# Patient Record
Sex: Male | Born: 1990 | Race: Black or African American | Hispanic: No | Marital: Single | Smoking: Current every day smoker
Health system: Southern US, Community
[De-identification: ages and names within clinical notes are randomized; demographics above are authoritative.]

## PROBLEM LIST (undated history)

## (undated) DIAGNOSIS — J45909 Unspecified asthma, uncomplicated: Secondary | ICD-10-CM

## (undated) HISTORY — PX: TONSILLECTOMY: SUR1361

---
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
## 2012-09-08 ENCOUNTER — Emergency Department (HOSPITAL_COMMUNITY)
Admission: EM | Admit: 2012-09-08 | Discharge: 2012-09-08 | Disposition: A | Discharge status: Home / Self Care | Payer: Medicaid Other | Attending: Emergency Medicine | Admitting: Emergency Medicine

## 2012-09-08 ENCOUNTER — Encounter (HOSPITAL_COMMUNITY): Payer: Self-pay | Admitting: Family Medicine

## 2012-09-08 DIAGNOSIS — N342 Other urethritis: Secondary | ICD-10-CM | POA: Insufficient documentation

## 2012-09-08 DIAGNOSIS — F17200 Nicotine dependence, unspecified, uncomplicated: Secondary | ICD-10-CM | POA: Insufficient documentation

## 2012-09-08 HISTORY — DX: Unspecified asthma, uncomplicated: J45.909

## 2012-09-08 LAB — URINALYSIS, ROUTINE W REFLEX MICROSCOPIC
Bilirubin Urine: NEGATIVE
Glucose, UA: NEGATIVE mg/dL
Hgb urine dipstick: NEGATIVE
Ketones, ur: NEGATIVE mg/dL
Nitrite: NEGATIVE
Protein, ur: NEGATIVE mg/dL
Specific Gravity, Urine: 1.018 (ref 1.005–1.030)
Urobilinogen, UA: 1 mg/dL (ref 0.0–1.0)
pH: 6 (ref 5.0–8.0)

## 2012-09-08 LAB — URINE MICROSCOPIC-ADD ON

## 2012-09-08 MED ORDER — LIDOCAINE HCL (PF) 1 % IJ SOLN
INTRAMUSCULAR | Status: AC
  Administered 2012-09-08: 2 mL
  Filled 2012-09-08: qty 5

## 2012-09-08 MED ORDER — ONDANSETRON 4 MG PO TBDP
8.0000 mg | ORAL_TABLET | Freq: Once | ORAL | Status: AC
  Administered 2012-09-08: 8 mg via ORAL
  Filled 2012-09-08: qty 2

## 2012-09-08 MED ORDER — AZITHROMYCIN 1 G PO PACK
1.0000 g | PACK | Freq: Once | ORAL | Status: AC
  Administered 2012-09-08: 1 g via ORAL
  Filled 2012-09-08: qty 1

## 2012-09-08 MED ORDER — CEFTRIAXONE SODIUM 250 MG IJ SOLR
250.0000 mg | Freq: Once | INTRAMUSCULAR | Status: AC
  Administered 2012-09-08: 250 mg via INTRAMUSCULAR
  Filled 2012-09-08: qty 250

## 2012-09-08 NOTE — ED Provider Notes (Signed)
History   This chart was scribed for Hurman Horn, MD by Melba Coon. The patient was seen in room TR04C/TR04C and the patient's care was started at 7:37PM.    CSN: [id number]  Arrival date & time 09/08/12  1718   None     Chief Complaint  Patient presents with  . Exposure to STD    (Consider location/radiation/quality/duration/timing/severity/associated sxs/prior treatment) HPI Jeffrey Rodgers is a 21 y.o. male who presents to the Emergency Department complaining of persistent, moderate watery penile d/c with an onset 2 weeks ago. Jeffrey Rodgers states that he was having unprotected sex. Pruritis around the groin and penile area present. No testicular pain. No HA, fever, neck pain, sore throat, rash, back pain, CP, SOB, abd pain, n/v/d, dysuria, or extremity pain, edema, weakness, numbness, or tingling. Coughing and congestion with CP present from seasonal allergies. No Hx of HIV/AIDS. No known allergies. No other pertinent medical symptoms.  Past Medical History  Diagnosis Date  . Asthma     History reviewed. No pertinent past surgical history.  History reviewed. No pertinent family history.  History  Substance Use Topics  . Smoking status: Current Every Day Smoker  . Smokeless tobacco: Not on file  . Alcohol Use: No      Review of Systems 10 Systems reviewed and all are negative for acute change except as noted in the HPI.   Allergies  Review of patient's allergies indicates no known allergies.  Home Medications  No current outpatient prescriptions on file.  BP 113/69  Pulse 58  Temp 99.7 F (37.6 C) (Oral)  Resp 16  SpO2 98%  Physical Exam  Nursing note and vitals reviewed. Constitutional: He is oriented to person, place, and time. He appears well-developed and well-nourished. No distress.  HENT:  Head: Normocephalic and atraumatic.  Eyes: EOM are normal. Pupils are equal, round, and reactive to light.  Neck: Normal range of motion. Neck supple. No  tracheal deviation present.  Cardiovascular: Normal rate, normal heart sounds and intact distal pulses.   Pulmonary/Chest: Effort normal and breath sounds normal. No respiratory distress.  Abdominal: Bowel sounds are normal. He exhibits no distension. There is no tenderness.  Genitourinary:       No testicular tenderness. No inguinal LAN or tenderness. No penile lesions.  Musculoskeletal: Normal range of motion. He exhibits no edema and no tenderness.  Neurological: He is alert and oriented to person, place, and time. He has normal strength. No cranial nerve deficit or sensory deficit.  Skin: Skin is warm and dry. No rash noted.  Psychiatric: He has a normal mood and affect. His behavior is normal.    ED Course  Procedures (including critical care time)  COORDINATION OF CARE:  7:41PM - UA results reviewed and are unremarkable. STD workup for chlamydia and gonorrhea will be ordered for Jeffrey. Raul Rodgers. He is advised to f/u with his PCP for the results. He requests to have an HIV test with his PCP. He is also advised to notify all sexual partners that he got tested today and to use protection in the future. He will be treated with Rocephin and Zithromax. Jeffrey Rodgers is ready for d/c.   Labs Reviewed  URINALYSIS, ROUTINE W REFLEX MICROSCOPIC - Abnormal; Notable for the following:    Leukocytes, UA MODERATE (*)     All other components within normal limits  GC/CHLAMYDIA PROBE AMP, GENITAL  URINE MICROSCOPIC-ADD ON   No results found.   1. Urethritis  MDM  I personally performed the services described in this documentation, which was scribed in my presence. The recorded information has been reviewed and considered. Pt stable in ED with no significant deterioration in condition.Patient / Family / Caregiver informed of clinical course, understand medical decision-making process, and agree with plan.I doubt any other EMC precluding discharge at this time including, but not necessarily  limited to the following:nec fasc, sepsis.      Hurman Horn, MD 09/09/12 [phone]
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
## 2013-09-19 ENCOUNTER — Emergency Department (HOSPITAL_COMMUNITY)
Admission: EM | Admit: 2013-09-19 | Discharge: 2013-09-19 | Disposition: A | Discharge status: Home / Self Care | Payer: Medicaid Other | Source: Home / Self Care

## 2013-09-19 ENCOUNTER — Encounter (HOSPITAL_COMMUNITY): Payer: Self-pay | Admitting: Emergency Medicine

## 2013-09-19 ENCOUNTER — Emergency Department (INDEPENDENT_AMBULATORY_CARE_PROVIDER_SITE_OTHER)
Admission: EM | Admit: 2013-09-19 | Discharge: 2013-09-19 | Disposition: A | Discharge status: Home / Self Care | Payer: Self-pay | Source: Home / Self Care | Attending: Family Medicine | Admitting: Family Medicine

## 2013-09-19 DIAGNOSIS — H60399 Other infective otitis externa, unspecified ear: Secondary | ICD-10-CM

## 2013-09-19 DIAGNOSIS — L03116 Cellulitis of left lower limb: Secondary | ICD-10-CM

## 2013-09-19 DIAGNOSIS — H6091 Unspecified otitis externa, right ear: Secondary | ICD-10-CM

## 2013-09-19 DIAGNOSIS — L02419 Cutaneous abscess of limb, unspecified: Secondary | ICD-10-CM

## 2013-09-19 MED ORDER — CIPROFLOXACIN-DEXAMETHASONE 0.3-0.1 % OT SUSP: 4.0000 [drp] | Freq: Two times a day (BID) | OTIC | Status: DC

## 2013-09-19 MED ORDER — DOXYCYCLINE HYCLATE 100 MG PO CAPS: 100.0000 mg | ORAL_CAPSULE | Freq: Two times a day (BID) | ORAL | Status: DC

## 2013-09-19 NOTE — ED Provider Notes (Signed)
Jeffrey Rodgers is a 22 y.o. male who presents to Urgent Care today for  1) left lateral thigh skin infection. Patient notes a small bump that has become larger more painful and red over the last 2 days. He has not tried any medications or treatment. He denies any radiating pain weakness numbness fevers or chills. He feels well otherwise. He denies any injury or actual bug bite. 2) right ear drainage and pain for the last 2 months. Patient notes mild pain associated with foul-smelling urine drainage. He feels well otherwise no fevers chills nausea vomiting or diarrhea   Past Medical History  Diagnosis Date  . Asthma    History  Substance Use Topics  . Smoking status: Current Every Day Smoker  . Smokeless tobacco: Not on file  . Alcohol Use: No   ROS as above Medications reviewed. No current facility-administered medications for this encounter.   Current Outpatient Prescriptions  Medication Sig Dispense Refill  . ciprofloxacin-dexamethasone (CIPRODEX) otic suspension Place 4 drops into the right ear 2 (two) times daily.  7.5 mL  0  . doxycycline (VIBRAMYCIN) 100 MG capsule Take 1 capsule (100 mg total) by mouth 2 (two) times daily.  20 capsule  0    Exam:  BP 115/73  Pulse 48  Temp(Src) 97.9 F (36.6 C) (Oral)  Resp 20  SpO2 99% Gen: Well NAD HEENT: EOMI,  MMM, right tympanic membrane with yellowish mucousy appearance external to the tympanic membrane. Left tympanic membranes normal appearing Lungs: CTABL Nl WOB Heart: RRR no MRG Abd: NABS, NT, ND Exts: Non edematous BL  LE, warm and well perfused.  Skin: Left lateral leg: Quarter-sized indurated erythematous tender nodule. No fluctuance or drainage. No surrounding skin changes.  No results found for this or any previous visit (from the past 24 hour(s)). No results found.  Assessment and Plan: 22 y.o. male with  1) left lateral leg cellulitis. Plan to treat with doxycycline. Followup as needed. 2) otitis externa of the  right ear: Plan to treat with Ciprodex eardrops 2 Aleve twice daily for pain as needed Discussed warning signs or symptoms. Please see discharge instructions. Patient expresses understanding.      Rodolph Bong, MD 09/19/13 [phone]

## 2013-09-19 NOTE — ED Notes (Addendum)
Patient c/o pain and swelling left thigh, "insect bite" that has been draining pus x 2 days; pain "9" on 1-10 scale, w/d/color good, calm, conversant

## 2013-12-08 ENCOUNTER — Emergency Department (HOSPITAL_COMMUNITY)
Admission: EM | Admit: 2013-12-08 | Discharge: 2013-12-08 | Disposition: A | Discharge status: Home / Self Care | Payer: Medicaid Other | Attending: Emergency Medicine | Admitting: Emergency Medicine

## 2013-12-08 ENCOUNTER — Encounter (HOSPITAL_COMMUNITY): Payer: Self-pay | Admitting: Emergency Medicine

## 2013-12-08 DIAGNOSIS — R319 Hematuria, unspecified: Secondary | ICD-10-CM | POA: Insufficient documentation

## 2013-12-08 DIAGNOSIS — J45909 Unspecified asthma, uncomplicated: Secondary | ICD-10-CM | POA: Insufficient documentation

## 2013-12-08 DIAGNOSIS — R599 Enlarged lymph nodes, unspecified: Secondary | ICD-10-CM | POA: Insufficient documentation

## 2013-12-08 DIAGNOSIS — F17200 Nicotine dependence, unspecified, uncomplicated: Secondary | ICD-10-CM | POA: Insufficient documentation

## 2013-12-08 DIAGNOSIS — Z792 Long term (current) use of antibiotics: Secondary | ICD-10-CM | POA: Insufficient documentation

## 2013-12-08 LAB — URINALYSIS, ROUTINE W REFLEX MICROSCOPIC
Glucose, UA: NEGATIVE mg/dL
Ketones, ur: 15 mg/dL — AB
Protein, ur: 30 mg/dL — AB
Urobilinogen, UA: 1 mg/dL (ref 0.0–1.0)

## 2013-12-08 LAB — URINE MICROSCOPIC-ADD ON

## 2013-12-08 MED ORDER — CEFTRIAXONE SODIUM 250 MG IJ SOLR
250.0000 mg | Freq: Once | INTRAMUSCULAR | Status: AC
  Administered 2013-12-08: 250 mg via INTRAMUSCULAR
  Filled 2013-12-08: qty 250

## 2013-12-08 MED ORDER — AZITHROMYCIN 250 MG PO TABS
1000.0000 mg | ORAL_TABLET | Freq: Once | ORAL | Status: AC
  Administered 2013-12-08: 1000 mg via ORAL
  Filled 2013-12-08: qty 4

## 2013-12-08 NOTE — ED Notes (Signed)
Pt presents with blood in his urine since yesterday.
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
## 2013-12-08 NOTE — ED Provider Notes (Signed)
CSN: [id number]     Arrival date & time 12/08/13  1326 History  This chart was scribed for non-physician practitioner, Rhea Bleacher, PA-C working with Junius Argyle, MD by Greggory Stallion, ED scribe. This patient was seen in room WTR7/WTR7 and the patient's care was started at 5:38 PM.   Chief Complaint  Patient presents with  . Hematuria   The history is provided by the patient. No language interpreter was used.   HPI Comments: Jeffrey Rodgers is a 22 y.o. male who presents to the Emergency Department complaining of hematuria that started yesterday. States there is blood the entire time he urinates. He states he has been having unprotected sex with his girlfriend but denies any other partners. Pt states he sometimes has a mildly painful bump near his genitals. Denies fever, dysuria, penile discharge, penile pain, testicular pain, penile sores or lesions, back pain. Denies history of kidney stone. He has a history of asthma but denies other medical problems.   Past Medical History  Diagnosis Date  . Asthma    History reviewed. No pertinent past surgical history. History reviewed. No pertinent family history. History  Substance Use Topics  . Smoking status: Current Every Day Smoker -- 0.50 packs/day    Types: Cigarettes  . Smokeless tobacco: Not on file  . Alcohol Use: No    Review of Systems  Constitutional: Negative for fever.  HENT: Negative for sore throat.   Eyes: Negative for discharge.  Gastrointestinal: Negative for rectal pain.  Genitourinary: Positive for hematuria. Negative for dysuria, frequency, discharge, genital sores, penile pain and testicular pain.  Musculoskeletal: Negative for arthralgias and back pain.  Skin: Negative for rash.  Hematological: Negative for adenopathy.    Allergies  Review of patient's allergies indicates no known allergies.  Home Medications   Current Outpatient Rx  Name  Route  Sig  Dispense  Refill  . ciprofloxacin-dexamethasone  (CIPRODEX) otic suspension   Right Ear   Place 4 drops into the right ear 2 (two) times daily.   7.5 mL   0   . doxycycline (VIBRAMYCIN) 100 MG capsule   Oral   Take 1 capsule (100 mg total) by mouth 2 (two) times daily.   20 capsule   0    BP 117/74  Pulse 54  Temp(Src) 98.9 F (37.2 C) (Oral)  Resp 19  SpO2 100%  Physical Exam  Nursing note and vitals reviewed. Constitutional: He is oriented to person, place, and time. He appears well-developed and well-nourished. No distress.  HENT:  Head: Normocephalic and atraumatic.  Eyes: Conjunctivae and EOM are normal.  Neck: Normal range of motion. Neck supple. No tracheal deviation present.  Cardiovascular: Normal rate.   Pulmonary/Chest: Effort normal. No respiratory distress.  Abdominal: Soft. He exhibits no distension. There is no tenderness. There is no rebound and no guarding. Hernia confirmed negative in the right inguinal area and confirmed negative in the left inguinal area.  Genitourinary: Testes normal and penis normal.    Right testis shows no mass, no swelling and no tenderness. Left testis shows no mass, no swelling and no tenderness. No penile tenderness. No discharge found.  Small right inguinal lymph node.   Musculoskeletal: Normal range of motion.  Lymphadenopathy:       Right: Inguinal adenopathy present.       Left: No inguinal adenopathy present.  Neurological: He is alert and oriented to person, place, and time.  Skin: Skin is warm and dry.  Psychiatric: He has  a normal mood and affect. His behavior is normal.    ED Course  Procedures (including critical care time)  DIAGNOSTIC STUDIES: Oxygen Saturation is 100% on RA, normal by my interpretation.    COORDINATION OF CARE: 5:43 PM-Discussed treatment plan which includes UA with pt at bedside and pt agreed to plan. Advised pt to follow up with a urologist if symptoms do not resolve.   Labs Review Labs Reviewed  URINALYSIS, ROUTINE W REFLEX  MICROSCOPIC - Abnormal; Notable for the following:    Color, Urine RED (*)    APPearance TURBID (*)    Hgb urine dipstick LARGE (*)    Bilirubin Urine MODERATE (*)    Ketones, ur 15 (*)    Protein, ur 30 (*)    Nitrite POSITIVE (*)    Leukocytes, UA MODERATE (*)    All other components within normal limits  GC/CHLAMYDIA PROBE AMP  URINE MICROSCOPIC-ADD ON   Imaging Review No results found.  EKG Interpretation   None      Vital signs reviewed and are as follows: Filed Vitals:   12/08/13 1858  BP: 132/68  Pulse: 51  Temp: 97.8 F (36.6 C)  Resp: 18   GU exam performed. Pt informed of results.   Plan: Tx for GC/chlamydia given, urine probe sent. Will treat for infection as this is most likely cause. If it does not improve, will need urology f/u (given).   Patient urged to return with worsening symptoms or other concerns, including lightheadedness/syncope. Patient verbalized understanding and agrees with plan.    MDM   1. Hematuria    Painless hematuria in 22yo male. Most likely etiology is infection -- treated for gonorrhea/chlamydia. If not improving, will need urology f/u. No systemic symptoms of anemia, VSS. No dysuria, no fever or discharge.  I personally performed the services described in this documentation, which was scribed in my presence. The recorded information has been reviewed and is accurate.   Renne Crigler, PA-C 12/08/13 [id number]

## 2013-12-09 NOTE — ED Provider Notes (Signed)
Medical screening examination/treatment/procedure(s) were performed by non-physician practitioner and as supervising physician I was immediately available for consultation/collaboration.  EKG Interpretation   None         Junius Argyle, MD 12/09/13 [phone]

## 2013-12-10 LAB — GC/CHLAMYDIA PROBE AMP: CT Probe RNA: POSITIVE — AB

## 2013-12-11 ENCOUNTER — Telehealth (HOSPITAL_COMMUNITY): Payer: Self-pay | Admitting: Emergency Medicine

## 2013-12-11 NOTE — ED Notes (Signed)
+  Chlamydia. Patient treated with Rocephin and Zithromax.

## 2013-12-11 NOTE — ED Notes (Signed)
Patient has +Chlamydia. 

## 2014-07-06 ENCOUNTER — Encounter (HOSPITAL_COMMUNITY): Payer: Self-pay | Admitting: Emergency Medicine

## 2014-07-06 ENCOUNTER — Emergency Department (HOSPITAL_COMMUNITY): Payer: Medicaid Other

## 2014-07-06 ENCOUNTER — Emergency Department (HOSPITAL_COMMUNITY)
Admission: EM | Admit: 2014-07-06 | Discharge: 2014-07-06 | Disposition: A | Discharge status: Home / Self Care | Payer: Medicaid Other | Attending: Emergency Medicine | Admitting: Emergency Medicine

## 2014-07-06 DIAGNOSIS — S8990XA Unspecified injury of unspecified lower leg, initial encounter: Secondary | ICD-10-CM | POA: Diagnosis present

## 2014-07-06 DIAGNOSIS — S99919A Unspecified injury of unspecified ankle, initial encounter: Secondary | ICD-10-CM

## 2014-07-06 DIAGNOSIS — Z792 Long term (current) use of antibiotics: Secondary | ICD-10-CM | POA: Insufficient documentation

## 2014-07-06 DIAGNOSIS — F17200 Nicotine dependence, unspecified, uncomplicated: Secondary | ICD-10-CM | POA: Insufficient documentation

## 2014-07-06 DIAGNOSIS — J45909 Unspecified asthma, uncomplicated: Secondary | ICD-10-CM | POA: Insufficient documentation

## 2014-07-06 DIAGNOSIS — S82109B Unspecified fracture of upper end of unspecified tibia, initial encounter for open fracture type I or II: Secondary | ICD-10-CM | POA: Diagnosis not present

## 2014-07-06 DIAGNOSIS — S99929A Unspecified injury of unspecified foot, initial encounter: Secondary | ICD-10-CM

## 2014-07-06 DIAGNOSIS — W3400XA Accidental discharge from unspecified firearms or gun, initial encounter: Secondary | ICD-10-CM

## 2014-07-06 DIAGNOSIS — Z23 Encounter for immunization: Secondary | ICD-10-CM | POA: Diagnosis not present

## 2014-07-06 DIAGNOSIS — S82202B Unspecified fracture of shaft of left tibia, initial encounter for open fracture type I or II: Secondary | ICD-10-CM

## 2014-07-06 LAB — CBC WITH DIFFERENTIAL/PLATELET
BASOS ABS: 0 10*3/uL (ref 0.0–0.1)
BASOS PCT: 0 % (ref 0–1)
EOS ABS: 0.2 10*3/uL (ref 0.0–0.7)
EOS PCT: 1 % (ref 0–5)
HCT: 43.7 % (ref 39.0–52.0)
Hemoglobin: 15.6 g/dL (ref 13.0–17.0)
LYMPHS PCT: 32 % (ref 12–46)
Lymphs Abs: 3.4 10*3/uL (ref 0.7–4.0)
MCH: 30.2 pg (ref 26.0–34.0)
MCHC: 35.7 g/dL (ref 30.0–36.0)
MCV: 84.5 fL (ref 78.0–100.0)
Monocytes Absolute: 0.9 10*3/uL (ref 0.1–1.0)
Monocytes Relative: 8 % (ref 3–12)
Neutro Abs: 6.2 10*3/uL (ref 1.7–7.7)
Neutrophils Relative %: 59 % (ref 43–77)
PLATELETS: 225 10*3/uL (ref 150–400)
RBC: 5.17 MIL/uL (ref 4.22–5.81)
RDW: 13.7 % (ref 11.5–15.5)
WBC: 10.6 10*3/uL — AB (ref 4.0–10.5)

## 2014-07-06 LAB — I-STAT CHEM 8, ED
BUN: 11 mg/dL (ref 6–23)
CALCIUM ION: 1.07 mmol/L — AB (ref 1.12–1.23)
CREATININE: 1.3 mg/dL (ref 0.50–1.35)
Chloride: 105 mEq/L (ref 96–112)
GLUCOSE: 115 mg/dL — AB (ref 70–99)
HCT: 47 % (ref 39.0–52.0)
HEMOGLOBIN: 16 g/dL (ref 13.0–17.0)
Potassium: 3.5 mEq/L — ABNORMAL LOW (ref 3.7–5.3)
Sodium: 140 mEq/L (ref 137–147)
TCO2: 23 mmol/L (ref 0–100)

## 2014-07-06 LAB — BASIC METABOLIC PANEL
ANION GAP: 20 — AB (ref 5–15)
BUN: 12 mg/dL (ref 6–23)
CALCIUM: 9.5 mg/dL (ref 8.4–10.5)
CO2: 23 meq/L (ref 19–32)
Chloride: 100 mEq/L (ref 96–112)
Creatinine, Ser: 1.13 mg/dL (ref 0.50–1.35)
Glucose, Bld: 109 mg/dL — ABNORMAL HIGH (ref 70–99)
Potassium: 3.6 mEq/L — ABNORMAL LOW (ref 3.7–5.3)
SODIUM: 143 meq/L (ref 137–147)

## 2014-07-06 LAB — ABO/RH: ABO/RH(D): A POS

## 2014-07-06 LAB — TYPE AND SCREEN
ABO/RH(D): A POS
Antibody Screen: NEGATIVE

## 2014-07-06 MED ORDER — SODIUM CHLORIDE 0.9 % IV BOLUS (SEPSIS)
1000.0000 mL | Freq: Once | INTRAVENOUS | Status: AC
  Administered 2014-07-06: 1000 mL via INTRAVENOUS

## 2014-07-06 MED ORDER — TRAMADOL HCL 50 MG PO TABS: 50.0000 mg | ORAL_TABLET | Freq: Four times a day (QID) | ORAL | Status: DC | PRN

## 2014-07-06 MED ORDER — HYDROMORPHONE HCL PF 1 MG/ML IJ SOLN: 1.0000 mg | Freq: Once | INTRAMUSCULAR | Status: DC

## 2014-07-06 MED ORDER — CEFAZOLIN SODIUM 1-5 GM-% IV SOLN
1.0000 g | Freq: Once | INTRAVENOUS | Status: AC
  Administered 2014-07-06: 1 g via INTRAVENOUS
  Filled 2014-07-06: qty 50

## 2014-07-06 MED ORDER — HYDROMORPHONE HCL PF 1 MG/ML IJ SOLN: 1.0000 mg | INTRAMUSCULAR | Status: DC | PRN

## 2014-07-06 MED ORDER — TETANUS-DIPHTH-ACELL PERTUSSIS 5-2.5-18.5 LF-MCG/0.5 IM SUSP
0.5000 mL | Freq: Once | INTRAMUSCULAR | Status: AC
  Administered 2014-07-06: 0.5 mL via INTRAMUSCULAR
  Filled 2014-07-06: qty 0.5

## 2014-07-06 MED ORDER — HYDROMORPHONE HCL PF 1 MG/ML IJ SOLN
1.0000 mg | Freq: Once | INTRAMUSCULAR | Status: AC
  Administered 2014-07-06: 1 mg via INTRAVENOUS

## 2014-07-06 MED ORDER — SODIUM CHLORIDE 0.9 % IV SOLN
INTRAVENOUS | Status: DC
  Administered 2014-07-06: 03:00:00 via INTRAVENOUS

## 2014-07-06 MED ORDER — HYDROMORPHONE HCL PF 1 MG/ML IJ SOLN
INTRAMUSCULAR | Status: AC
  Filled 2014-07-06: qty 1

## 2014-07-06 MED ORDER — CEPHALEXIN 500 MG PO CAPS: 500.0000 mg | ORAL_CAPSULE | Freq: Four times a day (QID) | ORAL | Status: DC

## 2014-07-06 MED ORDER — HYDROCODONE-ACETAMINOPHEN 5-325 MG PO TABS: 1.0000 | ORAL_TABLET | Freq: Four times a day (QID) | ORAL | Status: DC | PRN
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
## 2014-07-06 NOTE — ED Notes (Addendum)
Ortho at bedside.
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
## 2014-07-06 NOTE — ED Notes (Signed)
Patient given tetanus booster card
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
## 2015-06-09 ENCOUNTER — Emergency Department (HOSPITAL_COMMUNITY)
Admission: EM | Admit: 2015-06-09 | Discharge: 2015-06-09 | Disposition: A | Discharge status: Home / Self Care | Payer: Medicaid Other | Attending: Emergency Medicine | Admitting: Emergency Medicine

## 2015-06-09 ENCOUNTER — Encounter (HOSPITAL_COMMUNITY): Payer: Self-pay | Admitting: Emergency Medicine

## 2015-06-09 ENCOUNTER — Emergency Department (HOSPITAL_COMMUNITY): Payer: Medicaid Other

## 2015-06-09 DIAGNOSIS — M791 Myalgia: Secondary | ICD-10-CM | POA: Insufficient documentation

## 2015-06-09 DIAGNOSIS — Z720 Tobacco use: Secondary | ICD-10-CM | POA: Insufficient documentation

## 2015-06-09 DIAGNOSIS — J45909 Unspecified asthma, uncomplicated: Secondary | ICD-10-CM | POA: Insufficient documentation

## 2015-06-09 DIAGNOSIS — R369 Urethral discharge, unspecified: Secondary | ICD-10-CM | POA: Insufficient documentation

## 2015-06-09 DIAGNOSIS — Z79899 Other long term (current) drug therapy: Secondary | ICD-10-CM | POA: Insufficient documentation

## 2015-06-09 DIAGNOSIS — M25511 Pain in right shoulder: Secondary | ICD-10-CM | POA: Insufficient documentation

## 2015-06-09 DIAGNOSIS — R109 Unspecified abdominal pain: Secondary | ICD-10-CM | POA: Insufficient documentation

## 2015-06-09 MED ORDER — AZITHROMYCIN 250 MG PO TABS
1000.0000 mg | ORAL_TABLET | Freq: Once | ORAL | Status: AC
  Administered 2015-06-09: 1000 mg via ORAL
  Filled 2015-06-09: qty 4

## 2015-06-09 MED ORDER — STERILE WATER FOR INJECTION IJ SOLN
2.0000 mL | Freq: Once | INTRAMUSCULAR | Status: AC
  Administered 2015-06-09: 2 mL via INTRAMUSCULAR
  Filled 2015-06-09: qty 10

## 2015-06-09 MED ORDER — CEFTRIAXONE SODIUM 250 MG IJ SOLR
250.0000 mg | Freq: Once | INTRAMUSCULAR | Status: AC
  Administered 2015-06-09: 250 mg via INTRAMUSCULAR
  Filled 2015-06-09: qty 250

## 2015-06-09 NOTE — Discharge Instructions (Signed)
Acromioclavicular Injuries °The AC (acromioclavicular) joint is the joint in the shoulder where the collarbone (clavicle) meets the shoulder blade (scapula). The part of the shoulder blade connected to the collarbone is called the acromion. Common problems with and treatments for the AC joint are detailed below. °ARTHRITIS °Arthritis occurs when the joint has been injured and the smooth padding between the joints (cartilage) is lost. This is the wear and tear seen in most joints of the body if they have been overused. This causes the joint to produce pain and swelling which is worse with activity.  °AC JOINT SEPARATION °AC joint separation means that the ligaments connecting the acromion of the shoulder blade and collarbone have been damaged, and the two bones no longer line up. AC separations can be anywhere from mild to severe, and are "graded" depending upon which ligaments are torn and how badly they are torn. °· Grade I Injury: the least damage is done, and the AC joint still lines up. °· Grade II Injury: damage to the ligaments which reinforce the AC joint. In a Grade II injury, these ligaments are stretched but not entirely torn. When stressed, the AC joint becomes painful and unstable. °· Grade III Injury: AC and secondary ligaments are completely torn, and the collarbone is no longer attached to the shoulder blade. This results in deformity; a prominence of the end of the clavicle. °AC JOINT FRACTURE °AC joint fracture means that there has been a break in the bones of the AC joint, usually the end of the clavicle. °TREATMENT °TREATMENT OF AC ARTHRITIS °· There is currently no way to replace the cartilage damaged by arthritis. The best way to improve the condition is to decrease the activities which aggravate the problem. Application of ice to the joint helps decrease pain and soreness (inflammation). The use of non-steroidal anti-inflammatory medication is helpful. °· If less conservative measures do not  work, then cortisone shots (injections) may be used. These are anti-inflammatories; they decrease the soreness in the joint and swelling. °· If non-surgical measures fail, surgery may be recommended. The procedure is generally removal of a portion of the end of the clavicle. This is the part of the collarbone closest to your acromion which is stabilized with ligaments to the acromion of the shoulder blade. This surgery may be performed using a tube-like instrument with a light (arthroscope) for looking into a joint. It may also be performed as an open surgery through a small incision by the surgeon. Most patients will have good range of motion within 6 weeks and may return to all activity including sports by 8-12 weeks, barring complications. °TREATMENT OF AN AC SEPARATION °· The initial treatment is to decrease pain. This is best accomplished by immobilizing the arm in a sling and placing an ice pack to the shoulder for 20 to 30 minutes every 2 hours as needed. As the pain starts to subside, it is important to begin moving the fingers, wrist, elbow and eventually the shoulder in order to prevent a stiff or "frozen" shoulder. Instruction on when and how much to move the shoulder will be provided by your caregiver. The length of time needed to regain full motion and function depends on the amount or grade of the injury. Recovery from a Grade I AC separation usually takes 10 to 14 days, whereas a Grade III may take 6 to 8 weeks. °· Grade I and II separations usually do not require surgery. Even Grade III injuries usually allow return to full   activity with few restrictions. Treatment is also based on the activity demands of the injured shoulder. For example, a high level quarterback with an injured throwing arm will receive more aggressive treatment than someone with a desk job who rarely uses his/her arm for strenuous activities. In some cases, a painful lump may persist which could require a later surgery. Surgery  can be very successful, but the benefits must be weighed against the potential risks. TREATMENT OF AN AC JOINT FRACTURE Fracture treatment depends on the type of fracture. Sometimes a splint or sling may be all that is required. Other times surgery may be required for repair. This is more frequently the case when the ligaments supporting the clavicle are completely torn. Your caregiver will help you with these decisions and together you can decide what will be the best treatment. HOME CARE INSTRUCTIONS   Apply ice to the injury for 15-20 minutes each hour while awake for 2 days. Put the ice in a plastic bag and place a towel between the bag of ice and skin.  If a sling has been applied, wear it constantly for as long as directed by your caregiver, even at night. The sling or splint can be removed for bathing or showering or as directed. Be sure to keep the shoulder in the same place as when the sling is on. Do not lift the arm.  If a figure-of-eight splint has been applied it should be tightened gently by another person every day. Tighten it enough to keep the shoulders held back. Allow enough room to place the index finger between the body and strap. Loosen the splint immediately if there is numbness or tingling in the hands.  Take over-the-counter or prescription medicines for pain, discomfort or fever as directed by your caregiver.  If you or your child has received a follow up appointment, it is very important to keep that appointment in order to avoid long term complications, chronic pain or disability. SEEK MEDICAL CARE IF:   The pain is not relieved with medications.  There is increased swelling or discoloration that continues to get worse rather than better.  You or your child has been unable to follow up as instructed.  There is progressive numbness and tingling in the arm, forearm or hand. SEEK IMMEDIATE MEDICAL CARE IF:   The arm is numb, cold or pale.  There is increasing pain  in the hand, forearm or fingers. MAKE SURE YOU:   Understand these instructions.  Will watch your condition.  Will get help right away if you are not doing well or get worse. Document Released: 09/08/2005 Document Revised: 02/21/2012 Document Reviewed: 03/03/2009 [facility] Patient Information 2015 [place], [place]. This information is not intended to replace advice given to you by your health care provider. Make sure you discuss any questions you have with your health care provider. Sexually Transmitted Disease A sexually transmitted disease (STD) is a disease or infection that may be passed (transmitted) from person to person, usually during sexual activity. This may happen by way of saliva, semen, blood, vaginal mucus, or urine. Common STDs include:   Gonorrhea.   Chlamydia.   Syphilis.   HIV and AIDS.   Genital herpes.   Hepatitis B and C.   Trichomonas.   Human papillomavirus (HPV).   Pubic lice.   Scabies.  Mites.  Bacterial vaginosis. WHAT ARE CAUSES OF STDs? An STD may be caused by bacteria, a virus, or parasites. STDs are often transmitted during sexual activity if one  person is infected. However, they may also be transmitted through nonsexual means. STDs may be transmitted after:   Sexual intercourse with an infected person.   Sharing sex toys with an infected person.   Sharing needles with an infected person or using unclean piercing or tattoo needles.  Having intimate contact with the genitals, mouth, or rectal areas of an infected person.   Exposure to infected fluids during birth. WHAT ARE THE SIGNS AND SYMPTOMS OF STDs? Different STDs have different symptoms. Some people may not have any symptoms. If symptoms are present, they may include:   Painful or bloody urination.   Pain in the pelvis, abdomen, vagina, anus, throat, or eyes.   A skin rash, itching, or irritation.  Growths, ulcerations, blisters, or sores in the genital and anal  areas.  Abnormal vaginal discharge with or without bad odor.   Penile discharge in men.   Fever.   Pain or bleeding during sexual intercourse.   Swollen glands in the groin area.   Yellow skin and eyes (jaundice). This is seen with hepatitis.   Swollen testicles.  Infertility.  Sores and blisters in the mouth. HOW ARE STDs DIAGNOSED? To make a diagnosis, your health care provider may:   Take a medical history.   Perform a physical exam.   Take a sample of any discharge to examine.  Swab the throat, cervix, opening to the penis, rectum, or vagina for testing.  Test a sample of your first morning urine.   Perform blood tests.   Perform a Pap test, if this applies.   Perform a colposcopy.   Perform a laparoscopy.  HOW ARE STDs TREATED? Treatment depends on the STD. Some STDs may be treated but not cured.   Chlamydia, gonorrhea, trichomonas, and syphilis can be cured with antibiotic medicine.   Genital herpes, hepatitis, and HIV can be treated, but not cured, with prescribed medicines. The medicines lessen symptoms.   Genital warts from HPV can be treated with medicine or by freezing, burning (electrocautery), or surgery. Warts may come back.   HPV cannot be cured with medicine or surgery. However, abnormal areas may be removed from the cervix, vagina, or vulva.   If your diagnosis is confirmed, your recent sexual partners need treatment. This is true even if they are symptom-free or have a negative culture or evaluation. They should not have sex until their health care providers say it is okay. HOW CAN I REDUCE MY RISK OF GETTING AN STD? Take these steps to reduce your risk of getting an STD:  Use latex condoms, dental dams, and water-soluble lubricants during sexual activity. Do not use petroleum jelly or oils.  Avoid having multiple sex partners.  Do not have sex with someone who has other sex partners.  Do not have sex with anyone you do  not know or who is at high risk for an STD.  Avoid risky sex practices that can break your skin.  Do not have sex if you have open sores on your mouth or skin.  Avoid drinking too much alcohol or taking illegal drugs. Alcohol and drugs can affect your judgment and put you in a vulnerable position.  Avoid engaging in oral and anal sex acts.  Get vaccinated for HPV and hepatitis. If you have not received these vaccines in the past, talk to your health care provider about whether one or both might be right for you.   If you are at risk of being infected with HIV, it is  recommended that you take a prescription medicine daily to prevent HIV infection. This is called pre-exposure prophylaxis (PrEP). You are considered at risk if:  You are a man who has sex with other men (MSM).  You are a heterosexual man or woman and are sexually active with more than one partner.  You take drugs by injection.  You are sexually active with a partner who has HIV.  Talk with your health care provider about whether you are at high risk of being infected with HIV. If you choose to begin PrEP, you should first be tested for HIV. You should then be tested every 3 months for as long as you are taking PrEP.  WHAT SHOULD I DO IF I THINK I HAVE AN STD?  See your health care provider.   Tell your sexual partner(s). They should be tested and treated for any STDs.  Do not have sex until your health care provider says it is okay. WHEN SHOULD I GET IMMEDIATE MEDICAL CARE? Contact your health care provider right away if:   You have severe abdominal pain.  You are a man and notice swelling or pain in your testicles.  You are a woman and notice swelling or pain in your vagina. Document Released: 02/19/2003 Document Revised: 12/04/2013 Document Reviewed: 06/19/2013 [facility] Patient Information 2015 [place]. This information is not intended to replace advice given to you by your health care provider. Make  sure you discuss any questions you have with your health care provider.

## 2015-06-09 NOTE — ED Notes (Signed)
Declined W/C at D/C and was escorted to lobby by RN. 

## 2015-06-09 NOTE — ED Notes (Signed)
Pt reports when he tried to go to sleep around 0200 today he had right shoulder pain. Pt has full ROM to right arm. Pt reports history of shoulder dislocation.

## 2015-06-09 NOTE — ED Provider Notes (Signed)
CSN: [id number]     Arrival date & time 06/09/15  0768 History  This chart was scribed for non-physician practitioner, Roxy Horseman, PA-C working with Nelva Nay, MD by Placido Sou, ED scribe. This patient was seen in room TR05C/TR05C and the patient's care was started at 10:42 AM.    Chief Complaint  Patient presents with  . Shoulder Pain   The history is provided by the patient. No language interpreter was used.    HPI Comments: Jeffrey Rodgers is a 24 y.o. male who presents to the Emergency Department complaining of constant, mild, right shoulder pain with onset 9 hours ago. Pt notes dislocating the affected shoulder in high school but denies any recent trauma to the affected area.   Pt further requested an STD test due to abd pain with onset 1 month ago as well as intermittent penile discharge after urination.  Past Medical History  Diagnosis Date  . Asthma    Past Surgical History  Procedure Laterality Date  . Tonsillectomy     No family history on file. History  Substance Use Topics  . Smoking status: Current Every Day Smoker -- 0.50 packs/day    Types: Cigarettes  . Smokeless tobacco: Not on file  . Alcohol Use: No    Review of Systems  Gastrointestinal: Positive for abdominal pain.  Genitourinary: Positive for discharge.  Musculoskeletal: Positive for myalgias and arthralgias. Negative for joint swelling.      Allergies  Review of patient's allergies indicates no known allergies.  Home Medications   Prior to Admission medications   Medication Sig Start Date End Date Taking? Authorizing Provider  cephALEXin (KEFLEX) 500 MG capsule Take 1 capsule (500 mg total) by mouth 4 (four) times daily. 07/06/14   Derwood Kaplan, MD  HYDROcodone-acetaminophen (NORCO/VICODIN) 5-325 MG per tablet Take 1 tablet by mouth every 6 (six) hours as needed. 07/06/14   Ankit Rhunette Croft, MD   BP 133/76 mmHg  Pulse 63  Temp(Src) 98.3 F (36.8 C) (Oral)  Resp 16  Ht 6' (1.829 m)   Wt 160 lb (72.576 kg)  BMI 21.70 kg/m2  SpO2 100% Physical Exam  Constitutional: He is oriented to person, place, and time. He appears well-developed and well-nourished. No distress.  HENT:  Head: Normocephalic and atraumatic.  Mouth/Throat: Oropharynx is clear and moist.  Eyes: Conjunctivae and EOM are normal. Pupils are equal, round, and reactive to light.  Neck: Normal range of motion. Neck supple. No tracheal deviation present.  Cardiovascular: Normal rate.   Pulmonary/Chest: Effort normal and breath sounds normal. No respiratory distress.  Abdominal: Soft. He exhibits no distension.  Genitourinary:  Chaperone present for exam, no obvious penile discharge, no masses, lesions, tenderness about the penis, testes, or scrotum  Musculoskeletal: Normal range of motion.  Right shoulder range of motion and strength 5/5, no bony abnormality or deformity, negative Hawkins-Kennedy, negative empty can, no evidence of septic joint  Neurological: He is alert and oriented to person, place, and time.  Skin: Skin is warm and dry.  Psychiatric: He has a normal mood and affect. His behavior is normal.  Nursing note and vitals reviewed.   ED Course  Procedures  DIAGNOSTIC STUDIES: Oxygen Saturation is 100% on RA, normal by my interpretation.    COORDINATION OF CARE: 10:46 AM Discussed treatment plan with pt at bedside and pt agreed to plan.  Labs Review Labs Reviewed - No data to display  Imaging Review Dg Shoulder Right  06/09/2015   CLINICAL DATA:  Right  shoulder region pain beginning last night. Personal history of shoulder dislocation.  EXAM: RIGHT SHOULDER - 2+ VIEW  COMPARISON:  03/19/2010  FINDINGS: There is no evidence of fracture or dislocation. Slight offset at the [facility] joint could relate to an old AC injury. There is no evidence of arthropathy or other focal bone abnormality. Soft tissues are unremarkable.  IMPRESSION: Normal except for slight offset at the [facility] joint which could relate  to ligamentous laxity from an old injury.   Electronically Signed   By: Paulina Fusi M.D.   On: 06/09/2015 10:30     EKG Interpretation None      MDM   Final diagnoses:  Right shoulder pain  Penile discharge    Patient with right shoulder pain, plain films negative for acute injury, there does seem to be general laxity of the shoulder, will give a shoulder sling, and recommend orthopedic follow-up. Patient also requests an STD test secondary to penile discharge. Swab sent to lab. Will treat with azithromycin and Rocephin. STD precautions and instructions given. Patient is stable for discharge.  I personally performed the services described in this documentation, which was scribed in my presence. The recorded information has been reviewed and is accurate.     Roxy Horseman, PA-C 06/09/15 1202  Nelva Nay, MD 06/10/15 [phone]

## 2015-06-10 LAB — GC/CHLAMYDIA PROBE AMP (~~LOC~~) NOT AT ARMC
Chlamydia: NEGATIVE
NEISSERIA GONORRHEA: NEGATIVE

## 2017-12-15 ENCOUNTER — Emergency Department (HOSPITAL_COMMUNITY)
Admission: EM | Admit: 2017-12-15 | Discharge: 2017-12-16 | Disposition: A | Discharge status: Home / Self Care | Payer: Self-pay | Attending: Emergency Medicine | Admitting: Emergency Medicine

## 2017-12-15 ENCOUNTER — Emergency Department (HOSPITAL_COMMUNITY): Payer: Self-pay

## 2017-12-15 ENCOUNTER — Other Ambulatory Visit: Payer: Self-pay

## 2017-12-15 ENCOUNTER — Encounter (HOSPITAL_COMMUNITY): Payer: Self-pay | Admitting: Emergency Medicine

## 2017-12-15 DIAGNOSIS — Z23 Encounter for immunization: Secondary | ICD-10-CM | POA: Insufficient documentation

## 2017-12-15 DIAGNOSIS — Y998 Other external cause status: Secondary | ICD-10-CM | POA: Insufficient documentation

## 2017-12-15 DIAGNOSIS — Y9389 Activity, other specified: Secondary | ICD-10-CM | POA: Insufficient documentation

## 2017-12-15 DIAGNOSIS — J45909 Unspecified asthma, uncomplicated: Secondary | ICD-10-CM | POA: Insufficient documentation

## 2017-12-15 DIAGNOSIS — F17210 Nicotine dependence, cigarettes, uncomplicated: Secondary | ICD-10-CM | POA: Insufficient documentation

## 2017-12-15 DIAGNOSIS — W25XXXA Contact with sharp glass, initial encounter: Secondary | ICD-10-CM | POA: Insufficient documentation

## 2017-12-15 DIAGNOSIS — Y92190 Kitchen in other specified residential institution as the place of occurrence of the external cause: Secondary | ICD-10-CM | POA: Insufficient documentation

## 2017-12-15 DIAGNOSIS — S41112A Laceration without foreign body of left upper arm, initial encounter: Secondary | ICD-10-CM | POA: Insufficient documentation

## 2017-12-15 MED ORDER — TETANUS-DIPHTH-ACELL PERTUSSIS 5-2.5-18.5 LF-MCG/0.5 IM SUSP
0.5000 mL | Freq: Once | INTRAMUSCULAR | Status: AC
  Administered 2017-12-15: 0.5 mL via INTRAMUSCULAR
  Filled 2017-12-15: qty 0.5

## 2017-12-15 NOTE — Discharge Instructions (Signed)
Please read instructions below.  Keep your wound clean and covered.  The Steri-Strips will fall off on their own.  You can wash your wound daily with soap and water, and apply antibiotic ointment such as Neosporin or bacitracin. Follow up with your primary care or urgent care for wound recheck in 3 days.  Return to the ER for fever, pus draining from wound, redness, or new or worsening symptoms.

## 2017-12-15 NOTE — ED Provider Notes (Addendum)
[facility] EMERGENCY DEPARTMENT Provider Note   CSN: [id number] Arrival date & time: 12/15/17  2119     History   Chief Complaint Chief Complaint  Patient presents with  . Laceration    HPI Jeffrey Rodgers is a 27 y.o. male presenting with laceration to left wrist that occurred last night around 11pm. Pt states he accidentally cut his wrist on a broken piece of glass in the kitchen. States he cleaned his wound following the injury. Denies dec ROM, fever, purulent drainage, immunocompromise. Last tetanus unknown.  The history is provided by the patient.    Past Medical History:  Diagnosis Date  . Asthma     There are no active problems to display for this patient.   Past Surgical History:  Procedure Laterality Date  . TONSILLECTOMY         Home Medications    Prior to Admission medications   Medication Sig Start Date End Date Taking? Authorizing Provider  cephALEXin (KEFLEX) 500 MG capsule Take 1 capsule (500 mg total) by mouth 4 (four) times daily. 07/06/14   Derwood Kaplan, MD  HYDROcodone-acetaminophen (NORCO/VICODIN) 5-325 MG per tablet Take 1 tablet by mouth every 6 (six) hours as needed. 07/06/14   Derwood Kaplan, MD    Family History No family history on file.  Social History Social History   Tobacco Use  . Smoking status: Current Every Day Smoker    Packs/day: 0.50    Types: Cigarettes  Substance Use Topics  . Alcohol use: No  . Drug use: Yes    Types: Marijuana     Allergies   Patient has no known allergies.   Review of Systems Review of Systems  Constitutional: Negative for fever.  Skin: Positive for wound.     Physical Exam Updated Vital Signs BP 127/69 (BP Location: Right Arm)   Pulse 79   Temp 98.6 F (37 C) (Oral)   Resp 16   Ht 6' (1.829 m)   Wt 72.6 kg (160 lb)   SpO2 97%   BMI 21.70 kg/m   Physical Exam  Constitutional: He appears well-developed and well-nourished. No distress.  HENT:  Head:  Normocephalic and atraumatic.  Eyes: Conjunctivae are normal.  Cardiovascular: Intact distal pulses.  Pulmonary/Chest: Effort normal.  Musculoskeletal:  1cm linear laceration to dorsal left wrist. Not grossly contaminated, no purulent drainage or surrounding erythema.  Base of wound visualized through full range of motion of the wrist, without evidence of tendon or nerve injury.  No evidence of retained foreign body.  Wrist w normal active flexion. Intact distal pulses and sensation  Psychiatric: He has a normal mood and affect. His behavior is normal.  Nursing note and vitals reviewed.     ED Treatments / Results  Labs (all labs ordered are listed, but only abnormal results are displayed) Labs Reviewed - No data to display  EKG  EKG Interpretation None       Radiology Dg Forearm Left  Result Date: 12/15/2017 CLINICAL DATA:  Left forearm laceration ; rule out foreign body. EXAM: LEFT FOREARM - 2 VIEW COMPARISON:  None. FINDINGS: There is no evidence of fracture or other focal bone lesions. Soft tissues are unremarkable. No apparent radiopaque foreign body. Mild soft tissue swelling and irregularity along the volar and ulnar aspect of the distal forearm is noted. IMPRESSION: No radiopaque foreign body or acute osseous abnormality. Soft tissue irregularity along the volar, ulnar aspect of the distal forearm may represent site reported laceration.  Electronically Signed   By: Tollie Ethavid  Kwon M.D.   On: 12/15/2017 23:45    Procedures Procedures (including critical care time)  Medications Ordered in ED Medications  Tdap (BOOSTRIX) injection 0.5 mL (0.5 mLs Intramuscular Given 12/15/17 2220)     Initial Impression / Assessment and Plan / ED Course  I have reviewed the triage vital signs and the nursing notes.  Pertinent labs & imaging results that were available during my care of the patient were reviewed by me and considered in my medical decision making (see chart for details).      Patient with laceration to left wrist that occurred last night from a broken piece of glass in the kitchen.  1 cm laceration noted, not grossly contaminated, purulent drainage or surrounding erythema.  Wound irrigated in the ED, and base of wound visualized evidence of underlying tendinous injury.  X-ray obtained to rule out foreign body; neg.  Tetanus updated in the ED. Wound loosely closed with steri-strips to allow for continuous drainage. Discussed proper wound care, and follow-up for wound recheck. No hx of immunocompromise. Safe for discharge.  Discussed results, findings, treatment and follow up. Patient advised of return precautions. Patient verbalized understanding and agreed with plan.  Final Clinical Impressions(s) / ED Diagnoses   Final diagnoses:  Laceration of left upper extremity, initial encounter    ED Discharge Orders    None       Robinson, [place] N, PA-C 12/15/17 2354    Rolan BuccoBelfi, Melanie, MD 12/16/17 0006    Robinson, [place] N, PA-C 12/16/17 0006    Rolan BuccoBelfi, Melanie, MD 12/16/17 [phone]

## 2017-12-15 NOTE — ED Triage Notes (Signed)
Pt reports he cut L inner wrist on piece of glass last night. Tetanus not up to date.

## 2017-12-16 MED ORDER — IBUPROFEN 400 MG PO TABS
600.0000 mg | ORAL_TABLET | Freq: Once | ORAL | Status: AC
  Administered 2017-12-16: 600 mg via ORAL
  Filled 2017-12-16: qty 1

## 2018-12-16 ENCOUNTER — Emergency Department (HOSPITAL_COMMUNITY)
Admission: EM | Admit: 2018-12-16 | Discharge: 2018-12-16 | Disposition: A | Discharge status: Home / Self Care | Payer: Self-pay | Attending: Emergency Medicine | Admitting: Emergency Medicine

## 2018-12-16 ENCOUNTER — Emergency Department (HOSPITAL_COMMUNITY): Payer: Self-pay

## 2018-12-16 ENCOUNTER — Other Ambulatory Visit: Payer: Self-pay

## 2018-12-16 ENCOUNTER — Encounter (HOSPITAL_COMMUNITY): Payer: Self-pay | Admitting: Emergency Medicine

## 2018-12-16 DIAGNOSIS — F17210 Nicotine dependence, cigarettes, uncomplicated: Secondary | ICD-10-CM | POA: Insufficient documentation

## 2018-12-16 DIAGNOSIS — J45909 Unspecified asthma, uncomplicated: Secondary | ICD-10-CM | POA: Insufficient documentation

## 2018-12-16 DIAGNOSIS — L0201 Cutaneous abscess of face: Secondary | ICD-10-CM

## 2018-12-16 DIAGNOSIS — Z23 Encounter for immunization: Secondary | ICD-10-CM | POA: Insufficient documentation

## 2018-12-16 DIAGNOSIS — L0211 Cutaneous abscess of neck: Secondary | ICD-10-CM | POA: Insufficient documentation

## 2018-12-16 LAB — BASIC METABOLIC PANEL
ANION GAP: 8 (ref 5–15)
BUN: 10 mg/dL (ref 6–20)
CALCIUM: 9 mg/dL (ref 8.9–10.3)
CO2: 29 mmol/L (ref 22–32)
Chloride: 104 mmol/L (ref 98–111)
Creatinine, Ser: 0.97 mg/dL (ref 0.61–1.24)
GFR calc Af Amer: >60 mL/min (ref >60)
Glucose, Bld: 102 mg/dL — ABNORMAL HIGH (ref 70–99)
Potassium: 3.6 mmol/L (ref 3.5–5.1)
SODIUM: 141 mmol/L (ref 135–145)

## 2018-12-16 LAB — CBC WITH DIFFERENTIAL/PLATELET
Abs Immature Granulocytes: 0.02 10*3/uL (ref 0.00–0.07)
BASOS ABS: 0 10*3/uL (ref 0.0–0.1)
Basophils Relative: 0 %
Eosinophils Absolute: 0.3 10*3/uL (ref 0.0–0.5)
Eosinophils Relative: 3 %
HCT: 41.2 % (ref 39.0–52.0)
Hemoglobin: 14.2 g/dL (ref 13.0–17.0)
Immature Granulocytes: 0 %
LYMPHS PCT: 25 %
Lymphs Abs: 1.8 10*3/uL (ref 0.7–4.0)
MCH: 30.5 pg (ref 26.0–34.0)
MCHC: 34.5 g/dL (ref 30.0–36.0)
MCV: 88.4 fL (ref 80.0–100.0)
MONO ABS: 0.7 10*3/uL (ref 0.1–1.0)
MONOS PCT: 10 %
NEUTROS ABS: 4.5 10*3/uL (ref 1.7–7.7)
Neutrophils Relative %: 62 %
Platelets: 220 10*3/uL (ref 150–400)
RBC: 4.66 MIL/uL (ref 4.22–5.81)
RDW: 12.1 % (ref 11.5–15.5)
WBC: 7.3 10*3/uL (ref 4.0–10.5)
nRBC: 0 % (ref 0.0–0.2)

## 2018-12-16 MED ORDER — HYDROCODONE-ACETAMINOPHEN 7.5-325 MG PO TABS
1.0000 | ORAL_TABLET | Freq: Four times a day (QID) | ORAL | 0 refills | Status: AC | PRN
Start: 2018-12-16 — End: ?

## 2018-12-16 MED ORDER — HYDROCODONE-ACETAMINOPHEN 7.5-325 MG PO TABS: 1.0000 | ORAL_TABLET | Freq: Four times a day (QID) | ORAL | 0 refills | Status: AC | PRN

## 2018-12-16 MED ORDER — LIDOCAINE-EPINEPHRINE 2 %-1:200000 IJ SOLN
10.0000 mL | Freq: Once | INTRAMUSCULAR | Status: AC
Start: 2018-12-16 — End: 2018-12-16
  Administered 2018-12-16: 10 mL
  Filled 2018-12-16: qty 20

## 2018-12-16 MED ORDER — CLINDAMYCIN HCL 300 MG PO CAPS: 300.0000 mg | ORAL_CAPSULE | Freq: Three times a day (TID) | ORAL | 0 refills | Status: AC

## 2018-12-16 MED ORDER — IOHEXOL 300 MG/ML  SOLN
75.0000 mL | Freq: Once | INTRAMUSCULAR | Status: AC | PRN
  Administered 2018-12-16: 75 mL via INTRAVENOUS

## 2018-12-16 MED ORDER — MORPHINE SULFATE (PF) 4 MG/ML IV SOLN
4.0000 mg | Freq: Once | INTRAVENOUS | Status: AC
  Administered 2018-12-16: 4 mg via INTRAVENOUS
  Filled 2018-12-16: qty 1

## 2018-12-16 MED ORDER — TETANUS-DIPHTH-ACELL PERTUSSIS 5-2.5-18.5 LF-MCG/0.5 IM SUSP
0.5000 mL | Freq: Once | INTRAMUSCULAR | Status: AC
  Administered 2018-12-16: 0.5 mL via INTRAMUSCULAR
  Filled 2018-12-16: qty 0.5

## 2018-12-16 MED ORDER — HYDROCODONE-ACETAMINOPHEN 5-325 MG PO TABS
1.0000 | ORAL_TABLET | Freq: Once | ORAL | Status: AC
  Administered 2018-12-16: 1 via ORAL
  Filled 2018-12-16: qty 1

## 2018-12-16 NOTE — ED Provider Notes (Addendum)
[facility] EMERGENCY DEPARTMENT Provider Note   CSN: [id number] Arrival date & time: 12/16/18  0427     History   Chief Complaint Chief Complaint  Patient presents with  . Neck Pain  . Abscess    HPI Jeffrey Rodgers is a 28 y.o. male.  HPI complains of pain and swelling at submandibular area and superior aspect of neck onset 3 days ago.  He noticed a "bump" there 3 days ago and tried to cut the area open.  And swelling and pain have become worse.  He denies any difficulty swallowing no difficulty breathing.  No feeling of throat closing up.  No fever no vomiting no change in voice.  Nothing makes symptoms better or worse.  No other associated symptoms  Past Medical History:  Diagnosis Date  . Asthma     There are no active problems to display for this patient.   Past Surgical History:  Procedure Laterality Date  . TONSILLECTOMY          Home Medications    Prior to Admission medications   Medication Sig Start Date End Date Taking? Authorizing Provider  cephALEXin (KEFLEX) 500 MG capsule Take 1 capsule (500 mg total) by mouth 4 (four) times daily. 07/06/14   Derwood Kaplan, MD  HYDROcodone-acetaminophen (NORCO/VICODIN) 5-325 MG per tablet Take 1 tablet by mouth every 6 (six) hours as needed. 07/06/14   Derwood Kaplan, MD    Family History No family history on file.  Social History Social History   Tobacco Use  . Smoking status: Current Every Day Smoker    Packs/day: 0.50    Types: Cigarettes  . Smokeless tobacco: Never Used  Substance Use Topics  . Alcohol use: No  . Drug use: Yes    Types: Marijuana   No IV drug use  Allergies   Patient has no known allergies.   Review of Systems Review of Systems  Constitutional: Negative.   HENT: Negative.   Respiratory: Negative.   Cardiovascular: Negative.   Gastrointestinal: Negative.   Musculoskeletal: Positive for neck pain.  Skin: Negative.   Neurological: Negative.     Psychiatric/Behavioral: Negative.   All other systems reviewed and are negative.    Physical Exam Updated Vital Signs BP (!) 130/93 (BP Location: Right Arm)   Pulse (!) 54   Temp 98.4 F (36.9 C) (Oral)   Resp 16   SpO2 100%   Physical Exam Vitals signs and nursing note reviewed.  Constitutional:      Appearance: He is well-developed. He is not ill-appearing.  HENT:     Head: Normocephalic and atraumatic.     Comments: Reddened swollen tender firm area at right side of submandibular fibular area and superiormost aspect of neck, 3 to 4 cm in diameter.  No trismus.  Teeth appear normal.  Oropharynx normal.  Handling secretions well.  Trachea midline.  Voice normal    Right Ear: Tympanic membrane and ear canal normal.     Left Ear: Tympanic membrane, ear canal and external ear normal.  Eyes:     Conjunctiva/sclera: Conjunctivae normal.     Pupils: Pupils are equal, round, and reactive to light.  Neck:     Musculoskeletal: Neck supple.     Thyroid: No thyromegaly.     Trachea: No tracheal deviation.  Cardiovascular:     Rate and Rhythm: Regular rhythm.     Heart sounds: No murmur.     Comments: Mildly bradycardic Pulmonary:  Effort: Pulmonary effort is normal.     Breath sounds: Normal breath sounds.  Abdominal:     General: Bowel sounds are normal. There is no distension.     Palpations: Abdomen is soft.     Tenderness: There is no abdominal tenderness.  Musculoskeletal: Normal range of motion.        General: No tenderness.  Skin:    General: Skin is warm and dry.     Findings: No rash.  Neurological:     Mental Status: He is alert.     Coordination: Coordination normal.      ED Treatments / Results  Labs (all labs ordered are listed, but only abnormal results are displayed) Labs Reviewed  CBC WITH DIFFERENTIAL/PLATELET  BASIC METABOLIC PANEL    EKG None  Radiology No results found.  Procedures Procedures (including critical care  time)  Medications Ordered in ED Medications  morphine 4 MG/ML injection 4 mg (has no administration in time range)  Tdap (BOOSTRIX) injection 0.5 mL (has no administration in time range)   Results for orders placed or performed during the hospital encounter of 12/16/18  CBC with Differential/Platelet  Result Value Ref Range   WBC 7.3 4.0 - 10.5 K/uL   RBC 4.66 4.22 - 5.81 MIL/uL   Hemoglobin 14.2 13.0 - 17.0 g/dL   HCT 40.1 02.7 - 25.3 %   MCV 88.4 80.0 - 100.0 fL   MCH 30.5 26.0 - 34.0 pg   MCHC 34.5 30.0 - 36.0 g/dL   RDW 66.4 40.3 - 47.4 %   Platelets 220 150 - 400 K/uL   nRBC 0.0 0.0 - 0.2 %   Neutrophils Relative % 62 %   Neutro Abs 4.5 1.7 - 7.7 K/uL   Lymphocytes Relative 25 %   Lymphs Abs 1.8 0.7 - 4.0 K/uL   Monocytes Relative 10 %   Monocytes Absolute 0.7 0.1 - 1.0 K/uL   Eosinophils Relative 3 %   Eosinophils Absolute 0.3 0.0 - 0.5 K/uL   Basophils Relative 0 %   Basophils Absolute 0.0 0.0 - 0.1 K/uL   Immature Granulocytes 0 %   Abs Immature Granulocytes 0.02 0.00 - 0.07 K/uL  Basic metabolic panel  Result Value Ref Range   Sodium 141 135 - 145 mmol/L   Potassium 3.6 3.5 - 5.1 mmol/L   Chloride 104 98 - 111 mmol/L   CO2 29 22 - 32 mmol/L   Glucose, Bld 102 (H) 70 - 99 mg/dL   BUN 10 6 - 20 mg/dL   Creatinine, Ser 2.59 0.61 - 1.24 mg/dL   Calcium 9.0 8.9 - 56.3 mg/dL   GFR calc non Af Amer >60 >60 mL/min   GFR calc Af Amer >60 >60 mL/min   Anion gap 8 5 - 15   Ct Soft Tissue Neck W Contrast  Result Date: 12/16/2018 CLINICAL DATA:  Right-sided neck pain for the last 3 days. EXAM: CT NECK WITH CONTRAST TECHNIQUE: Multidetector CT imaging of the neck was performed using the standard protocol following the bolus administration of intravenous contrast. CONTRAST:  21mL OMNIPAQUE IOHEXOL 300 MG/ML  SOLN COMPARISON:  None. FINDINGS: Pharynx and larynx: No mucosal or submucosal lesion. Salivary glands: Parotid and submandibular glands are normal. Thyroid: Normal  Lymph nodes: Some reactive nodal prominence in the right side of the neck. No sign of suppuration. Vascular: No arterial or venous occlusion. Limited intracranial: Normal Visualized orbits: Normal Mastoids and visualized paranasal sinuses: Clear Skeleton: Normal. No evidence of dental or  periodontal disease. Upper chest: Normal Other: Submandibular space abscess on the right measuring approximately 4.4 x 5 x 2.2 cm. No evidence deep space extension. Generalized edematous changes of the right side of the face and neck without evidence of distant abscess. IMPRESSION: Submandibular space abscess on the right measuring 4.4 x 5 x 2.2 cm. Generalized edematous changes of the right side of the face and neck without evidence of distant abscess. No evidence predisposing dental or periodontal disease. Electronically Signed   By: Paulina FusiMark  Shogry M.D.   On: 12/16/2018 11:09    Initial Impression / Assessment and Plan / ED Course  I have reviewed the triage vital signs and the nursing notes.  Pertinent labs & imaging results that were available during my care of the patient were reviewed by me and considered in my medical decision making (see chart for details).     8:50 AM pain not improved after treatment intravenous morphine.  Additional IV morphine ordered. 12:20 PM patient resting comfortably.  I consulted Dr. Pollyann Kennedyosen from ENT service who will evaluate patient in ED for incision and drainage Lab work unremarkable.  Abscess was incised and drained by Dr. Pollyann Kennedyosen in the emergency department and he arrange for outpatient follow-up in his office in 2 days.  He wrote prescriptions for Norco and clindamycin.  Patient given Norco 1 tablet prior to discharge Final Clinical Impressions(s) / ED Diagnoses  Diagnosis facial abscess Final diagnoses:  None    ED Discharge Orders    None       Doug SouJacubowitz, Rowland Ericsson, MD 12/16/18 1226    Doug SouJacubowitz, Briel Gallicchio, MD 12/16/18 1355

## 2018-12-16 NOTE — Discharge Instructions (Addendum)
Keep the area clean and dry.  Change the dressing as needed.  Be careful not to accidentally pull out the packing.

## 2018-12-16 NOTE — Consult Note (Signed)
Reason for Consult: Neck skin abscess Referring Physician: Doug Rodgers, Sam, MD  Jeffrey Rodgers is an 28 y.o. male.  HPI: 3-day history of progressive worsening of right submandibular skin swelling and pain.  It started with a small pimple.  He has a history of ingrown hairs but has never had a skin abscess or infection before.  Otherwise in good health.  Past Medical History:  Diagnosis Date  . Asthma     Past Surgical History:  Procedure Laterality Date  . TONSILLECTOMY      No family history on file.  Social History:  reports that he has been smoking cigarettes. He has been smoking about 0.50 packs per day. He has never used smokeless tobacco. He reports current drug use. Drug: Marijuana. He reports that he does not drink alcohol.  Allergies: No Known Allergies  Medications: Reviewed  Results for orders placed or performed during the hospital encounter of 12/16/18 (from the past 48 hour(s))  CBC with Differential/Platelet     Status: None   Collection Time: 12/16/18  8:33 AM  Result Value Ref Range   WBC 7.3 4.0 - 10.5 K/uL   RBC 4.66 4.22 - 5.81 MIL/uL   Hemoglobin 14.2 13.0 - 17.0 g/dL   HCT 16.141.2 09.639.0 - 04.552.0 %   MCV 88.4 80.0 - 100.0 fL   MCH 30.5 26.0 - 34.0 pg   MCHC 34.5 30.0 - 36.0 g/dL   RDW 40.912.1 81.111.5 - 91.415.5 %   Platelets 220 150 - 400 K/uL   nRBC 0.0 0.0 - 0.2 %   Neutrophils Relative % 62 %   Neutro Abs 4.5 1.7 - 7.7 K/uL   Lymphocytes Relative 25 %   Lymphs Abs 1.8 0.7 - 4.0 K/uL   Monocytes Relative 10 %   Monocytes Absolute 0.7 0.1 - 1.0 K/uL   Eosinophils Relative 3 %   Eosinophils Absolute 0.3 0.0 - 0.5 K/uL   Basophils Relative 0 %   Basophils Absolute 0.0 0.0 - 0.1 K/uL   Immature Granulocytes 0 %   Abs Immature Granulocytes 0.02 0.00 - 0.07 K/uL    Comment: Performed at [facility], [address] [place], [place] [address]  Basic metabolic panel     Status: Abnormal   Collection Time: 12/16/18  8:33 AM  Result Value Ref Range   Sodium 141 135 - 145 mmol/L   Potassium 3.6 3.5 - 5.1 mmol/L   Chloride 104 98 - 111 mmol/L   CO2 29 22 - 32 mmol/L   Glucose, Bld 102 (H) 70 - 99 mg/dL   BUN 10 6 - 20 mg/dL   Creatinine, Ser 6.210.97 0.61 - 1.24 mg/dL   Calcium 9.0 8.9 - 30.810.3 mg/dL   GFR calc non Af Amer >60 >60 mL/min   GFR calc Af Amer >60 >60 mL/min   Anion gap 8 5 - 15    Comment: Performed at [facility], [address] [place], [place] [address]    Ct Soft Tissue Neck W Contrast  Result Date: 12/16/2018 CLINICAL DATA:  Right-sided neck pain for the last 3 days. EXAM: CT NECK WITH CONTRAST TECHNIQUE: Multidetector CT imaging of the neck was performed using the standard protocol following the bolus administration of intravenous contrast. CONTRAST:  75mL OMNIPAQUE IOHEXOL 300 MG/ML  SOLN COMPARISON:  None. FINDINGS: Pharynx and larynx: No mucosal or submucosal lesion. Salivary glands: Parotid and submandibular glands are normal. Thyroid: Normal Lymph nodes: Some reactive nodal prominence in the right  side of the neck. No sign of suppuration. Vascular: No arterial or venous occlusion. Limited intracranial: Normal Visualized orbits: Normal Mastoids and visualized paranasal sinuses: Clear Skeleton: Normal. No evidence of dental or periodontal disease. Upper chest: Normal Other: Submandibular space abscess on the right measuring approximately 4.4 x 5 x 2.2 cm. No evidence deep space extension. Generalized edematous changes of the right side of the face and neck without evidence of distant abscess. IMPRESSION: Submandibular space abscess on the right measuring 4.4 x 5 x 2.2 cm. Generalized edematous changes of the right side of the face and neck without evidence of distant abscess. No evidence predisposing dental or periodontal disease. Electronically Signed   By: Paulina FusiMark  Shogry M.D.   On: 12/16/2018 11:09    WJX:[id number]:Negative except as listed in admit H&P  Blood pressure (!) 125/94, pulse (!) 53, temperature 98.4 F (36.9 C),  temperature source Oral, resp. rate 16, SpO2 100 %.  PHYSICAL EXAM: Overall appearance:  Healthy appearing, in no distress Head:  Normocephalic, atraumatic. Ears: External ears look healthy. Nose: External nose is healthy in appearance. Internal nasal exam free of any lesions or obstruction. Oral Cavity/Pharynx:  There are no mucosal lesions or masses identified. Larynx/Hypopharynx: Deferred Neuro:  No identifiable neurologic deficits. Neck: 8 cm right submandibular skin abscess, with induration and erythema of the skin.  There is no spontaneous drainage..  Studies Reviewed: Neck CT reviewed.  This appears to be of dermal origin but does appear to extend deeper into the submandibular space.  Procedures: Incision and drainage:  The skin was prepped with Betadine solution.  2% Xylocaine with epinephrine was infiltrated around the entire abscess cavity.  A scalpel was used to incise the skin in a transverse fashion approximately 1-1/2 cm in length.  There is immediate production of purulent material.  The wound was probed with a hemostat and all loculations were opened up and all pus was expressed out.  The wound was then packed with half-inch Kling dressing.  A sterile 4 x 4 dressing was applied with tape.  He tolerated this well.   Assessment/Plan: Neck skin abscess, incision and drainage performed.  Keep packing in the wound.  Change dressing as necessary.  Start on clindamycin.  Follow-up in the office on Monday for wound check and packing replacement.  Jeffrey Rodgers 12/16/2018, 1:15 PM

## 2018-12-16 NOTE — ED Triage Notes (Addendum)
C/o swelling/abscess to R side of neck x 3 days.  States he tried to pull a hair out a few days ago.  Denies fever/chills.  Denies sore throat or SOB.

## 2018-12-16 NOTE — ED Notes (Signed)
Mia, PA notified of pt and came to triage to assess pt.  No imaging at this time per PA.

## 2019-01-09 IMAGING — CT CT NECK W/ CM
4 of 5 series · 14 of 33 positions shown, 16 images · IV contrast (APPLIED)
Comparison: None.

CLINICAL DATA: Right-sided neck pain for the last 3 days.

EXAM:
CT NECK WITH CONTRAST
TECHNIQUE: Multidetector CT imaging of the neck was performed using the
standard protocol following the bolus administration of intravenous
contrast.
CONTRAST:  75mL OMNIPAQUE IOHEXOL 300 MG/ML  SOLN

[Series 3: neck 2.0 i31s 3 · axial · 0.44mm/px · z∈[-299,-131]mm · 4 of 140 slices shown, 5 images]
[im 28/140  soft-tissue]
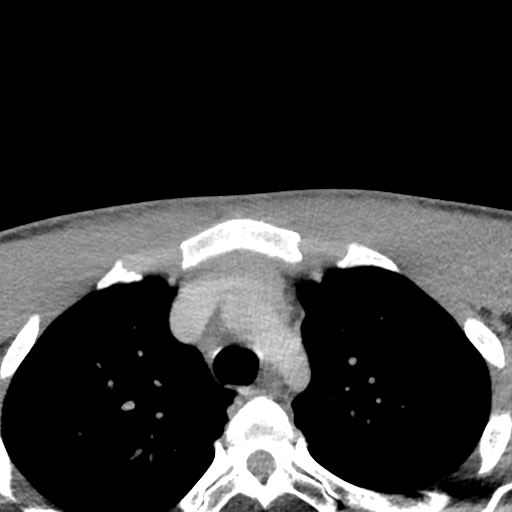
[im 28/140  bone]
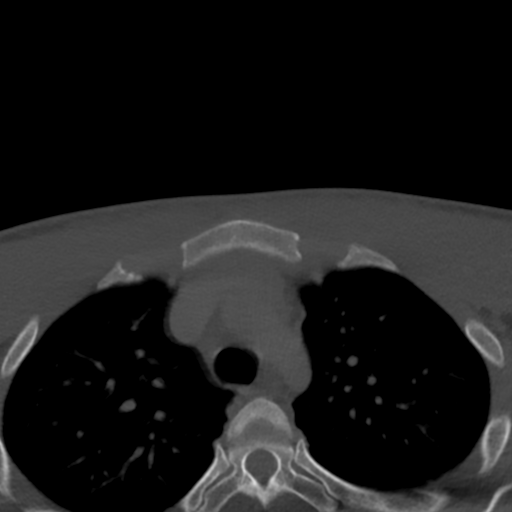
[im 56/140  bone]
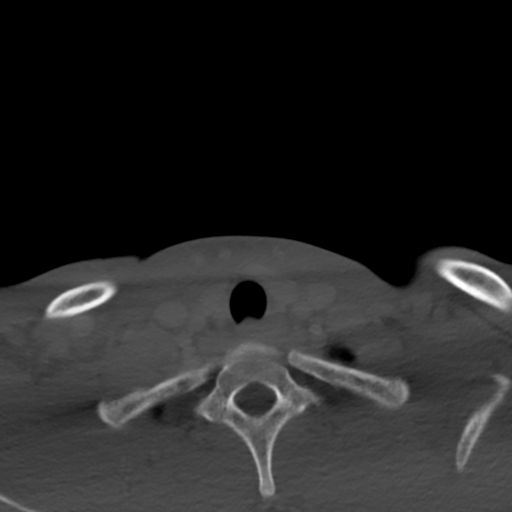
[im 84/140  bone]
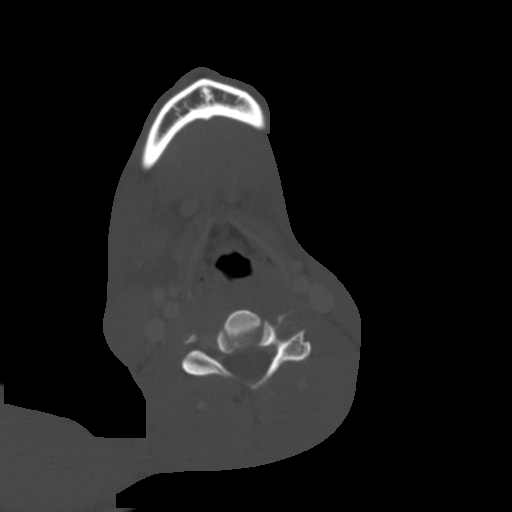
[im 112/140  bone]
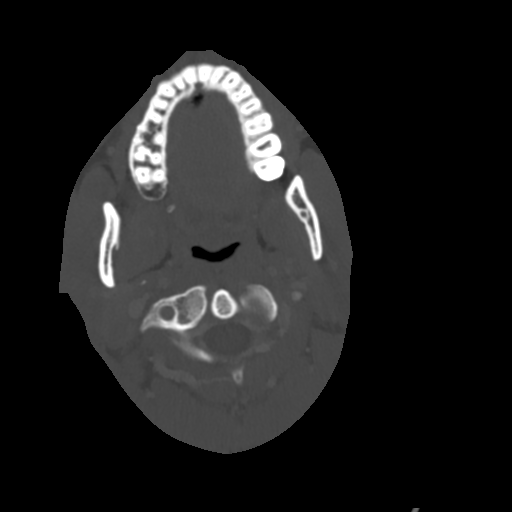

[Series 6: coronal st · coronal · 0.31mm/px · 3 of 101 slices shown]
[im 21/101  bone]
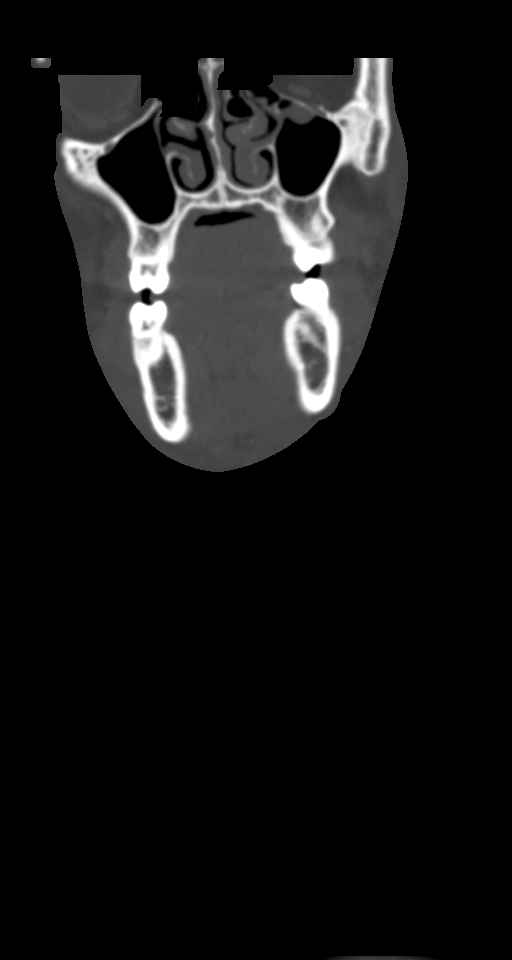
[im 41/101  bone]
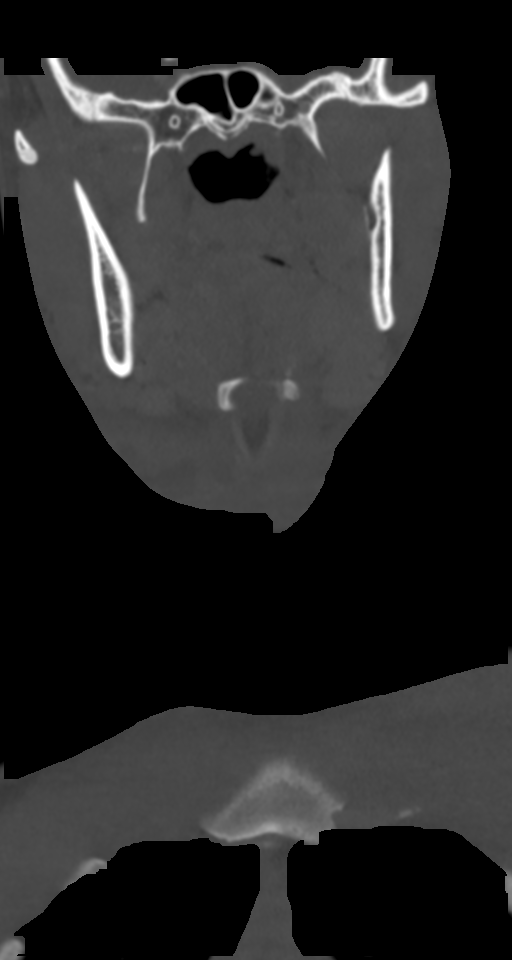
[im 61/101  bone]
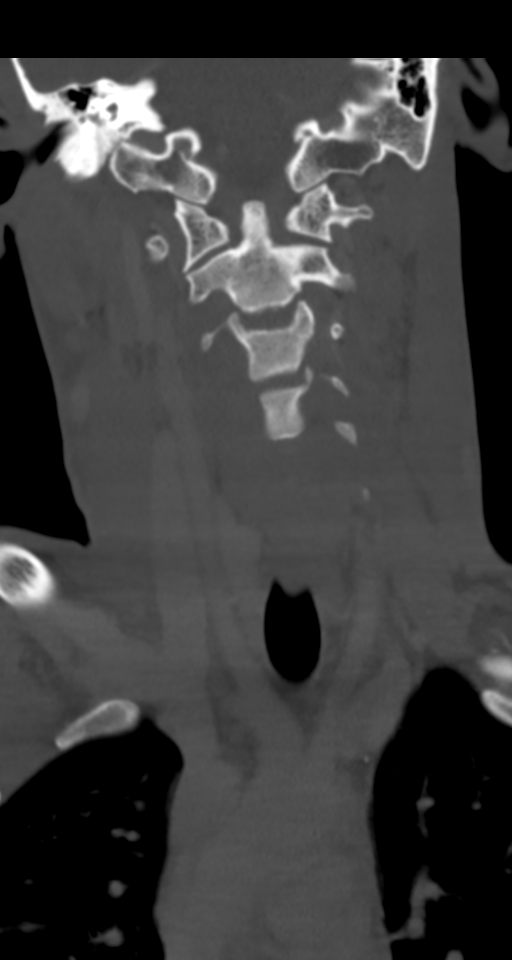

[Series 7: sagittal st · sagittal · 0.48mm/px · 5 of 101 slices shown, 6 images]
[im 34/101  bone]
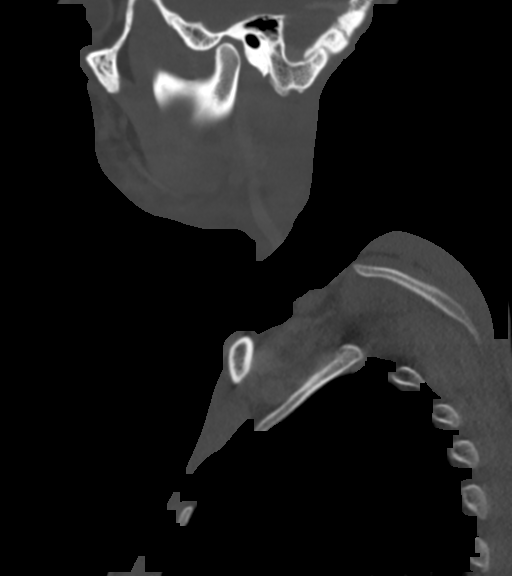
[im 42/101  bone]
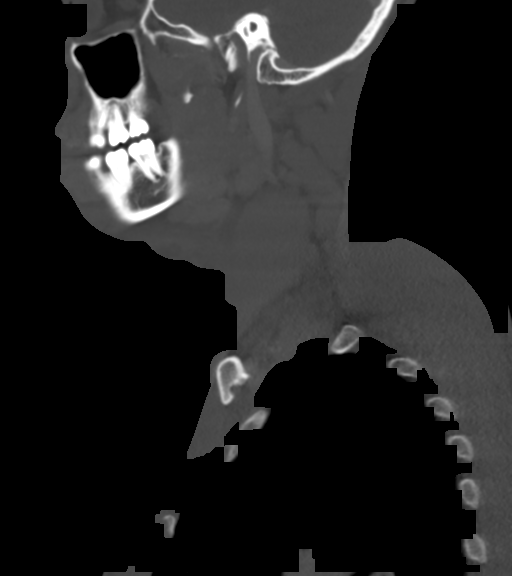
[im 51/101  soft-tissue]
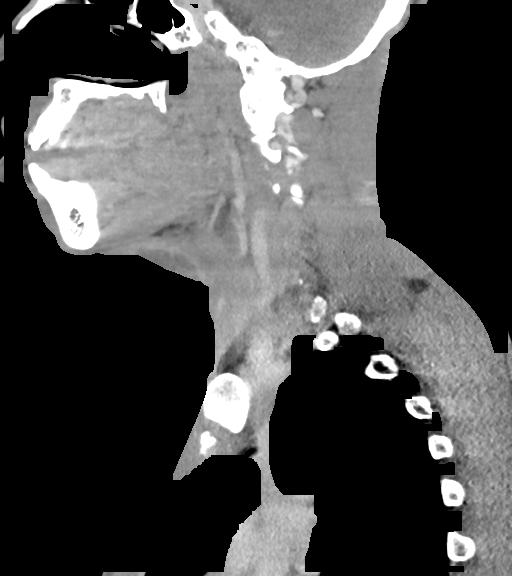
[im 51/101  bone]
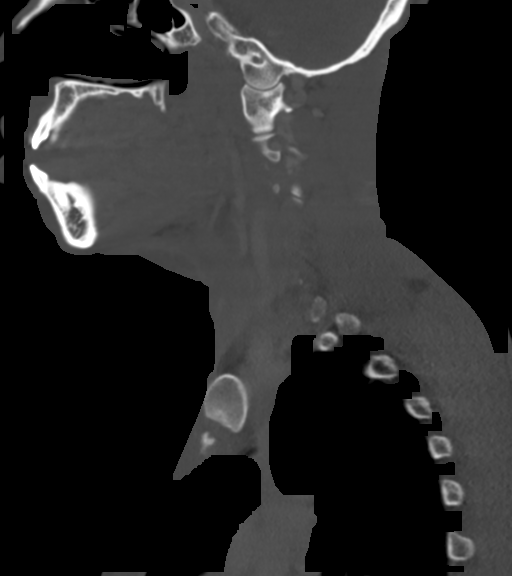
[im 59/101  bone]
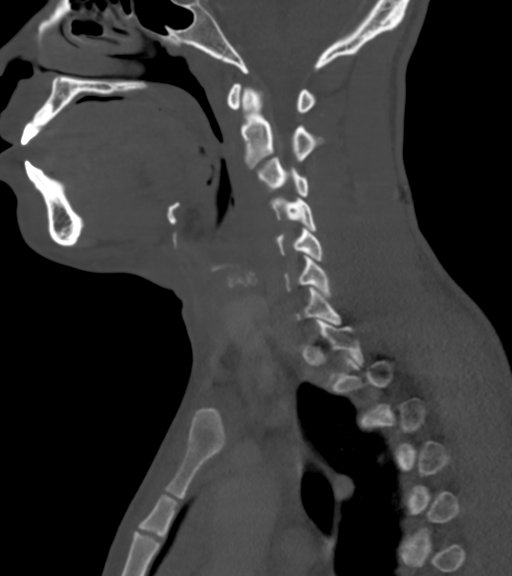
[im 67/101  bone]
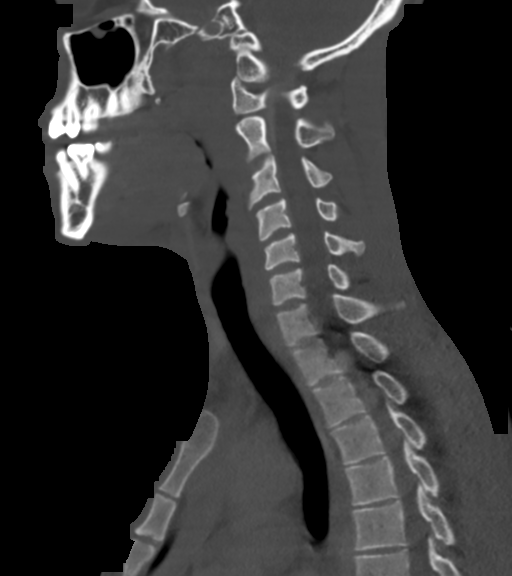

[Series 8: orthogonal st · axial · 0.39mm/px · z∈[-304,-236]mm · 2 of 138 slices shown]
[im 35/138  bone]
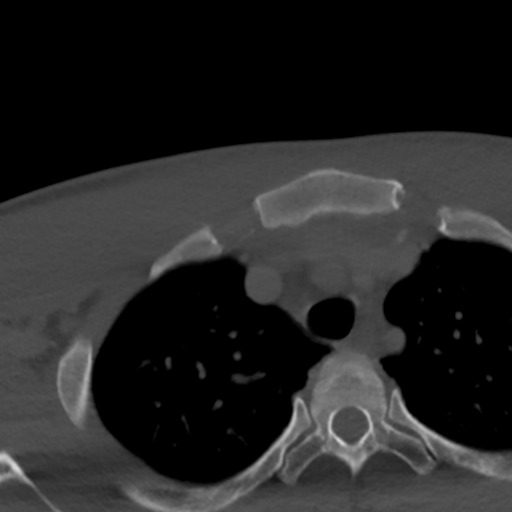
[im 69/138  bone]
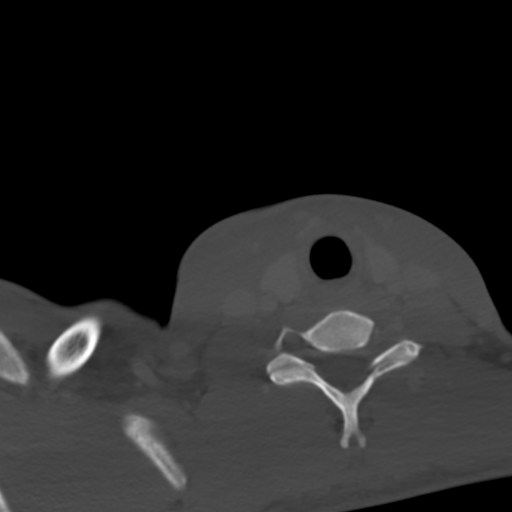

[14 of 33 positions shown; findings below may reference images not displayed]

FINDINGS: Pharynx and larynx: No mucosal or submucosal lesion.

Salivary glands: Parotid and submandibular glands are normal.

Thyroid: Normal

Lymph nodes: Some reactive nodal prominence in the right side of the
neck. No sign of suppuration.

Vascular: No arterial or venous occlusion.

Limited intracranial: Normal

Visualized orbits: Normal

Mastoids and visualized paranasal sinuses: Clear

Skeleton: Normal. No evidence of dental or periodontal disease.

Upper chest: Normal

Other: Submandibular space abscess on the right measuring
approximately 4.4 x 5 x 2.2 cm. No evidence deep space extension.
Generalized edematous changes of the right side of the face and neck
without evidence of distant abscess.
IMPRESSION: Submandibular space abscess on the right measuring 4.4 x 5 x 2.2 cm.
Generalized edematous changes of the right side of the face and neck
without evidence of distant abscess. No evidence predisposing dental
or periodontal disease.
